# Patient Record
Sex: Female | Born: 1990 | Race: Black or African American | Hispanic: No | Marital: Single | Smoking: Current some day smoker
Health system: Southern US, Community
[De-identification: ages and names within clinical notes are randomized; demographics above are authoritative.]

## PROBLEM LIST (undated history)

## (undated) DIAGNOSIS — J45909 Unspecified asthma, uncomplicated: Secondary | ICD-10-CM

## (undated) HISTORY — PX: NO PAST SURGERIES: SHX2092

---

## 2012-01-14 ENCOUNTER — Emergency Department (HOSPITAL_COMMUNITY)
Admission: EM | Admit: 2012-01-14 | Discharge: 2012-01-14 | Disposition: A | Discharge status: Home / Self Care | Payer: BC Managed Care – PPO | Source: Home / Self Care | Attending: Emergency Medicine | Admitting: Emergency Medicine

## 2012-01-14 ENCOUNTER — Encounter (HOSPITAL_COMMUNITY): Payer: Self-pay | Admitting: Emergency Medicine

## 2012-01-14 DIAGNOSIS — K047 Periapical abscess without sinus: Secondary | ICD-10-CM

## 2012-01-14 MED ORDER — HYDROCODONE-ACETAMINOPHEN 5-325 MG PO TABS: 2.0000 | ORAL_TABLET | ORAL | Status: DC | PRN

## 2012-01-14 MED ORDER — CLINDAMYCIN HCL 150 MG PO CAPS: 150.0000 mg | ORAL_CAPSULE | Freq: Three times a day (TID) | ORAL | Status: AC

## 2012-01-14 MED ORDER — PENICILLIN V POTASSIUM 500 MG PO TABS: 500.0000 mg | ORAL_TABLET | Freq: Four times a day (QID) | ORAL | Status: AC

## 2012-01-14 NOTE — ED Notes (Signed)
Reports tooth pain on left side since Saturday night.  Patient states her mouth is swollen and hurts badly.  Heat pad therapy completed but no relief.

## 2012-01-14 NOTE — ED Provider Notes (Signed)
History     CSN: [id number]  Arrival date & time 01/14/12  1335   First MD Initiated Contact with Patient 01/14/12 1354      Chief Complaint  Patient presents with  . Dental Pain    (Consider location/radiation/quality/duration/timing/severity/associated sxs/prior treatment) Patient is a 21 y.o. female presenting with tooth pain. The history is provided by the patient.  Dental PainThe primary symptoms include mouth pain. Primary symptoms do not include oral lesions, headaches, fever, shortness of breath, sore throat, angioedema or cough. The symptoms began 12 to 24 hours ago. The symptoms are worsening. The symptoms are new. The symptoms occur constantly.  Additional symptoms include: dental sensitivity to temperature, gum swelling, gum tenderness and facial swelling. Additional symptoms do not include: trouble swallowing, pain with swallowing, ear pain, hearing loss, swollen glands, goiter and fatigue.    History reviewed. No pertinent past medical history.  History reviewed. No pertinent past surgical history.  History reviewed. No pertinent family history.  History  Substance Use Topics  . Smoking status: Not on file  . Smokeless tobacco: Not on file  . Alcohol Use: Not on file    OB History    Grav Para Term Preterm Abortions TAB SAB Ect Mult Living                  Review of Systems  Constitutional: Positive for activity change and appetite change. Negative for fever, chills and fatigue.  HENT: Positive for facial swelling. Negative for hearing loss, ear pain, sore throat, trouble swallowing, neck pain and neck stiffness.   Respiratory: Negative for cough and shortness of breath.   Gastrointestinal: Negative for nausea and diarrhea.  Genitourinary: Negative for dysuria.  Skin: Negative for rash and wound.  Neurological: Negative for dizziness and headaches.    Allergies  Review of patient's allergies indicates no known allergies.  Home Medications   Current  Outpatient Rx  Name  Route  Sig  Dispense  Refill  . CLINDAMYCIN HCL 150 MG PO CAPS   Oral   Take 1 capsule (150 mg total) by mouth 3 (three) times daily.   15 capsule   0   . HYDROCODONE-ACETAMINOPHEN 5-325 MG PO TABS   Oral   Take 2 tablets by mouth every 4 (four) hours as needed for pain.   10 tablet   0   . PENICILLIN V POTASSIUM 500 MG PO TABS   Oral   Take 1 tablet (500 mg total) by mouth 4 (four) times daily.   28 tablet   0     BP 141/88  Pulse 73  Temp 99.7 F (37.6 C) (Oral)  Resp 18  SpO2 99%  Physical Exam  Constitutional: Vital signs are normal. She appears well-nourished.  Non-toxic appearance. She does not have a sickly appearance. She does not appear ill. No distress.  HENT:  Head: Normocephalic.  Right Ear: Tympanic membrane normal.  Left Ear: Tympanic membrane normal.  Mouth/Throat: Uvula is midline and mucous membranes are normal. Posterior oropharyngeal erythema present.  Eyes: Conjunctivae normal are normal.  Neck: Neck supple. No thyromegaly present.  Pulmonary/Chest: Effort normal and breath sounds normal. She has no decreased breath sounds. She has no wheezes. She has no rhonchi. She has no rales.  Abdominal: Soft.  Lymphadenopathy:    She has no cervical adenopathy.  Neurological: She is alert.  Skin: No rash noted.    ED Course  Procedures (including critical care time)  Labs Reviewed - No data to display  No results found.   1. Dental abscess       MDM  Abscessed left lower molar-penicillin        Jimmie Molly, MD 01/14/12 [phone]

## 2013-08-28 ENCOUNTER — Encounter (HOSPITAL_COMMUNITY): Payer: Self-pay

## 2013-08-28 ENCOUNTER — Inpatient Hospital Stay (HOSPITAL_COMMUNITY)
Admission: AD | Admit: 2013-08-28 | Discharge: 2013-08-28 | Disposition: A | Discharge status: Home / Self Care | Payer: BC Managed Care – PPO | Source: Ambulatory Visit | Attending: Obstetrics & Gynecology | Admitting: Obstetrics & Gynecology

## 2013-08-28 ENCOUNTER — Inpatient Hospital Stay (HOSPITAL_COMMUNITY): Payer: BC Managed Care – PPO

## 2013-08-28 DIAGNOSIS — R109 Unspecified abdominal pain: Secondary | ICD-10-CM | POA: Insufficient documentation

## 2013-08-28 DIAGNOSIS — O209 Hemorrhage in early pregnancy, unspecified: Secondary | ICD-10-CM | POA: Insufficient documentation

## 2013-08-28 DIAGNOSIS — N760 Acute vaginitis: Secondary | ICD-10-CM | POA: Diagnosis not present

## 2013-08-28 DIAGNOSIS — B9689 Other specified bacterial agents as the cause of diseases classified elsewhere: Secondary | ICD-10-CM | POA: Diagnosis not present

## 2013-08-28 DIAGNOSIS — A499 Bacterial infection, unspecified: Secondary | ICD-10-CM | POA: Diagnosis not present

## 2013-08-28 DIAGNOSIS — O2390 Unspecified genitourinary tract infection in pregnancy, unspecified trimester: Secondary | ICD-10-CM | POA: Insufficient documentation

## 2013-08-28 HISTORY — DX: Unspecified asthma, uncomplicated: J45.909

## 2013-08-28 LAB — CBC
HCT: 35.9 % — ABNORMAL LOW (ref 36.0–46.0)
Hemoglobin: 12.1 g/dL (ref 12.0–15.0)
MCH: 33 pg (ref 26.0–34.0)
MCHC: 33.7 g/dL (ref 30.0–36.0)
MCV: 97.8 fL (ref 78.0–100.0)
PLATELETS: 176 10*3/uL (ref 150–400)
RBC: 3.67 MIL/uL — AB (ref 3.87–5.11)
RDW: 11.8 % (ref 11.5–15.5)
WBC: 7.6 10*3/uL (ref 4.0–10.5)

## 2013-08-28 LAB — URINALYSIS, ROUTINE W REFLEX MICROSCOPIC
BILIRUBIN URINE: NEGATIVE
Glucose, UA: NEGATIVE mg/dL
Hgb urine dipstick: NEGATIVE
KETONES UR: NEGATIVE mg/dL
Leukocytes, UA: NEGATIVE
NITRITE: NEGATIVE
PH: 7 (ref 5.0–8.0)
Protein, ur: NEGATIVE mg/dL
Specific Gravity, Urine: 1.01 (ref 1.005–1.030)
UROBILINOGEN UA: 1 mg/dL (ref 0.0–1.0)

## 2013-08-28 LAB — WET PREP, GENITAL
Trich, Wet Prep: NONE SEEN
Yeast Wet Prep HPF POC: NONE SEEN

## 2013-08-28 LAB — ABO/RH: ABO/RH(D): O POS

## 2013-08-28 LAB — POCT PREGNANCY, URINE: Preg Test, Ur: POSITIVE — AB

## 2013-08-28 LAB — HCG, QUANTITATIVE, PREGNANCY: hCG, Beta Chain, Quant, S: 10102 m[IU]/mL — ABNORMAL HIGH (ref <5)

## 2013-08-28 MED ORDER — PRENATAL VITAMINS PLUS 27-1 MG PO TABS: 1.0000 | ORAL_TABLET | ORAL | Status: DC

## 2013-08-28 MED ORDER — METRONIDAZOLE 500 MG PO TABS: 500.0000 mg | ORAL_TABLET | Freq: Three times a day (TID) | ORAL | Status: AC

## 2013-08-28 NOTE — MAU Provider Note (Signed)
Attestation of Attending Supervision of Advanced Practitioner (CNM/NP): Evaluation and management procedures were performed by the Advanced Practitioner under my supervision and collaboration. I have reviewed the Advanced Practitioner's note and chart, and I agree with the management and plan.  LEGGETT,KELLY H. 7:11 PM

## 2013-08-28 NOTE — Discharge Instructions (Signed)
Vaginal Bleeding During Pregnancy, First Trimester A small amount of bleeding (spotting) from the vagina is relatively common in early pregnancy. It usually stops on its own. Various things may cause bleeding or spotting in early pregnancy. Some bleeding may be related to the pregnancy, and some may not. In most cases, the bleeding is normal and is not a problem. However, bleeding can also be a sign of something serious. Be sure to tell your health care provider about any vaginal bleeding right away. Some possible causes of vaginal bleeding during the first trimester include:  Infection or inflammation of the cervix.  Growths (polyps) on the cervix.  Miscarriage or threatened miscarriage.  Pregnancy tissue has developed outside of the uterus and in a fallopian tube (tubal pregnancy).  Tiny cysts have developed in the uterus instead of pregnancy tissue (molar pregnancy). HOME CARE INSTRUCTIONS  Watch your condition for any changes. The following actions may help to lessen any discomfort you are feeling:  Follow your health care provider's instructions for limiting your activity. If your health care provider orders bed rest, you may need to stay in bed and only get up to use the bathroom. However, your health care provider may allow you to continue light activity.  If needed, make plans for someone to help with your regular activities and responsibilities while you are on bed rest.  Keep track of the number of pads you use each day, how often you change pads, and how soaked (saturated) they are. Write this down.  Do not use tampons. Do not douche.  Do not have sexual intercourse or orgasms until approved by your health care provider.  If you pass any tissue from your vagina, save the tissue so you can show it to your health care provider.  Only take over-the-counter or prescription medicines as directed by your health care provider.  Do not take aspirin because it can make you  bleed.  Keep all follow-up appointments as directed by your health care provider. SEEK MEDICAL CARE IF:  You have any vaginal bleeding during any part of your pregnancy.  You have cramps or labor pains.  You have a fever, not controlled by medicine. SEEK IMMEDIATE MEDICAL CARE IF:   You have severe cramps in your back or belly (abdomen).  You pass large clots or tissue from your vagina.  Your bleeding increases.  You feel light-headed or weak, or you have fainting episodes.  You have chills.  You are leaking fluid or have a gush of fluid from your vagina.  You pass out while having a bowel movement. MAKE SURE YOU:  Understand these instructions.  Will watch your condition.  Will get help right away if you are not doing well or get worse. Document Released: 10/18/2004 Document Revised: 01/13/2013 Document Reviewed: 09/15/2012 [facility] Patient Information 2015 [place], [place]. This information is not intended to replace advice given to you by your health care provider. Make sure you discuss any questions you have with your health care provider.  Bacterial Vaginosis Bacterial vaginosis is a vaginal infection that occurs when the normal balance of bacteria in the vagina is disrupted. It results from an overgrowth of certain bacteria. This is the most common vaginal infection in women of childbearing age. Treatment is important to prevent complications, especially in pregnant women, as it can cause a premature delivery. CAUSES  Bacterial vaginosis is caused by an increase in harmful bacteria that are normally present in smaller amounts in the vagina. Several different kinds of bacteria  cause bacterial vaginosis. However, the reason that the condition develops is not fully understood. °RISK FACTORS °Certain activities or behaviors can put you at an increased risk of developing bacterial vaginosis, including: °· Having a new sex partner or multiple sex partners. °· Douching. °· Using  an intrauterine device (IUD) for contraception. °Women do not get bacterial vaginosis from toilet seats, bedding, swimming pools, or contact with objects around them. °SIGNS AND SYMPTOMS  °Some women with bacterial vaginosis have no signs or symptoms. Common symptoms include: °· Grey vaginal discharge. °· A fishlike odor with discharge, especially after sexual intercourse. °· Itching or burning of the vagina and vulva. °· Burning or pain with urination. °DIAGNOSIS  °Your health care provider will take a medical history and examine the vagina for signs of bacterial vaginosis. A sample of vaginal fluid may be taken. Your health care provider will look at this sample under a microscope to check for bacteria and abnormal cells. A vaginal pH test may also be done.  °TREATMENT  °Bacterial vaginosis may be treated with antibiotic medicines. These may be given in the form of a pill or a vaginal cream. A second round of antibiotics may be prescribed if the condition comes back after treatment.  °HOME CARE INSTRUCTIONS  °· Only take over-the-counter or prescription medicines as directed by your health care provider. °· If antibiotic medicine was prescribed, take it as directed. Make sure you finish it even if you start to feel better. °· Do not have sex until treatment is completed. °· Tell all sexual partners that you have a vaginal infection. They should see their health care provider and be treated if they have problems, such as a mild rash or itching. °· Practice safe sex by using condoms and only having one sex partner. °SEEK MEDICAL CARE IF:  °· Your symptoms are not improving after 3 days of treatment. °· You have increased discharge or pain. °· You have a fever. °MAKE SURE YOU:  °· Understand these instructions. °· Will watch your condition. °· Will get help right away if you are not doing well or get worse. °FOR MORE INFORMATION  °Centers for Disease Control and Prevention, Division of STD Prevention:  www.cdc.gov/std °American Sexual Health Association (ASHA): www.ashastd.org  °Document Released: 01/08/2005 Document Revised: 10/29/2012 Document Reviewed: 08/20/2012 °ExitCare® Patient Information ©2015 [employer], LLC. This information is not intended to replace advice given to you by your health care provider. Make sure you discuss any questions you have with your health care provider. ° °

## 2013-08-28 NOTE — MAU Provider Note (Signed)
Chief Complaint  Patient presents with  . Possible Pregnancy    Subjective Michele Navarro 23 y.o.  G2P0010 at 352w1d by LMP presents with onset today of first episode of small amount pink postcoital vaginal bleeding 3 days ago, last noted yesterday. Has menstrual-like crampy lower abdominal pain.  Denies irritative vaginal discharge. No dysuria or hematuria.  Blood type: unknown  Pregnancy course: NPC  Pertinent Medical History: asthma (no meds) Pertinent Ob/Gyn History: early EAB x1 Pertinent Surgical History: none Pertinent Social History: [place]  No prescriptions prior to admission    No Known Allergies   Objective   Filed Vitals:   08/28/13 1121  BP: 123/57  Pulse: 61  Temp: 98.1 F (36.7 C)  Resp: 16     Physical Exam General: WN/WD in NAD  Abdom: soft, NT External genitalia: normal; BUS neg  SSE: no blood; scant white discharge; cervix with no lesions, appears closed Bimanual: Cervix closed, long; uterus anteverted, NT, 6 weeks size; adnexa nontender, no masses   Lab Results Results for orders placed during the hospital encounter of 08/28/13 (from the past 24 hour(s))  URINALYSIS, ROUTINE W REFLEX MICROSCOPIC     Status: None   Collection Time    08/28/13 11:29 AM      Result Value Ref Range   Color, Urine YELLOW  YELLOW   APPearance CLEAR  CLEAR   Specific Gravity, Urine 1.010  1.005 - 1.030   pH 7.0  5.0 - 8.0   Glucose, UA NEGATIVE  NEGATIVE mg/dL   Hgb urine dipstick NEGATIVE  NEGATIVE   Bilirubin Urine NEGATIVE  NEGATIVE   Ketones, ur NEGATIVE  NEGATIVE mg/dL   Protein, ur NEGATIVE  NEGATIVE mg/dL   Urobilinogen, UA 1.0  0.0 - 1.0 mg/dL   Nitrite NEGATIVE  NEGATIVE   Leukocytes, UA NEGATIVE  NEGATIVE  POCT PREGNANCY, URINE     Status: Abnormal   Collection Time    08/28/13 12:02 PM      Result Value Ref Range   Preg Test, Ur POSITIVE (*) NEGATIVE  CBC     Status: Abnormal   Collection Time    08/28/13 12:31 PM      Result Value Ref Range   WBC  7.6  4.0 - 10.5 K/uL   RBC 3.67 (*) 3.87 - 5.11 MIL/uL   Hemoglobin 12.1  12.0 - 15.0 g/dL   HCT 09.835.9 (*) 11.936.0 - 14.746.0 %   MCV 97.8  78.0 - 100.0 fL   MCH 33.0  26.0 - 34.0 pg   MCHC 33.7  30.0 - 36.0 g/dL   RDW 82.911.8  56.211.5 - 13.015.5 %   Platelets 176  150 - 400 K/uL  ABO/RH     Status: None   Collection Time    08/28/13 12:31 PM      Result Value Ref Range   ABO/RH(D) O POS    HCG, QUANTITATIVE, PREGNANCY     Status: Abnormal   Collection Time    08/28/13 12:32 PM      Result Value Ref Range   hCG, Beta Chain, Quant, S 10102 (*) <5 mIU/mL  WET PREP, GENITAL     Status: Abnormal   Collection Time    08/28/13 12:45 PM      Result Value Ref Range   Yeast Wet Prep HPF POC NONE SEEN  NONE SEEN   Trich, Wet Prep NONE SEEN  NONE SEEN   Clue Cells Wet Prep HPF POC FEW (*) NONE SEEN  WBC, Wet Prep HPF POC FEW (*) NONE SEEN    Ultrasound  US Ob Comp Less 14 Wks  08/28/2013   CLINICAL DATA:  Early pregnancy, spotting  EXAM: OBSTETRIC <14 WK [place] AND TRANSVAGINAL OB US  TECHNIQUE: Both transabdominal and transvaginal ultrasound examinations were performed for complete evaluation of the gestation as well as the maternal uterus, adnexal regions, and pelvic cul-de-sac. Transvaginal technique was performed to assess early pregnancy.  COMPARISON:  None.  FINDINGS: Intrauterine gestational sac: Single  Yolk sac:  Present  Embryo:  Present  Cardiac Activity: Not identified  CRL:   2.4  mm   5 w 6 d                  [place] EDC: 04/24/2014  Maternal uterus/adnexae: There is a 3 cm simple cyst associated with the left ovary. Normal right ovary. Trace free fluid.  IMPRESSION: Single intrauterine gestation with embryo. No cardiac activity demonstrated at this stage.  Estimated gestational age by crown-rump length equals 5 weeks 6 days.   Electronically Signed   By: Genevive Bi M.D.   On: 08/28/2013 14:01   US Ob Transvaginal  08/28/2013   CLINICAL DATA:  Early pregnancy, spotting  EXAM: OBSTETRIC <14 WK [place] AND  TRANSVAGINAL OB US  TECHNIQUE: Both transabdominal and transvaginal ultrasound examinations were performed for complete evaluation of the gestation as well as the maternal uterus, adnexal regions, and pelvic cul-de-sac. Transvaginal technique was performed to assess early pregnancy.  COMPARISON:  None.  FINDINGS: Intrauterine gestational sac: Single  Yolk sac:  Present  Embryo:  Present  Cardiac Activity: Not identified  CRL:   2.4  mm   5 w 6 d                  [place] EDC: 04/24/2014  Maternal uterus/adnexae: There is a 3 cm simple cyst associated with the left ovary. Normal right ovary. Trace free fluid.  IMPRESSION: Single intrauterine gestation with embryo. No cardiac activity demonstrated at this stage.  Estimated gestational age by crown-rump length equals 5 weeks 6 days.   Electronically Signed   By: Genevive Bi M.D.   On: 08/28/2013 14:01   Assessment 1. Bleeding in early pregnancy   2. BV (bacterial vaginosis)    IUP, viability undetermined  Plan    GC/CT sent Discharge home See AVS for pt education   Medication List         metroNIDAZOLE 500 MG tablet  Commonly known as:  FLAGYL  Take 1 tablet (500 mg total) by mouth 3 (three) times daily.     PRENATAL VITAMINS PLUS 27-1 MG Tabs  Take 1 tablet by mouth 1 day or 1 dose.         Follow-up Information   Follow up with [facility] [place]. (Choose pregnancy care provider from the list given)    Contact information:   [address] [place] [address] [phone]      Follow up In 10 days. (Someone from [facility] will call with [place] appointmnet)        Andie Mortimer 08/28/2013 12:29 PM

## 2013-08-28 NOTE — MAU Note (Signed)
Patient states she has had a positive home pregnancy test x 3.  Wants confirmation.  Has had spotting yesterday and the day before but none today. Denies pain, or vomiting but has nausea off and on.

## 2013-08-29 LAB — GC/CHLAMYDIA PROBE AMP
CT Probe RNA: NEGATIVE
GC PROBE AMP APTIMA: NEGATIVE

## 2013-09-20 ENCOUNTER — Emergency Department (HOSPITAL_COMMUNITY): Payer: BC Managed Care – PPO

## 2013-09-20 ENCOUNTER — Emergency Department (HOSPITAL_COMMUNITY)
Admission: EM | Admit: 2013-09-20 | Discharge: 2013-09-20 | Disposition: A | Discharge status: Home / Self Care | Payer: BC Managed Care – PPO | Attending: Emergency Medicine | Admitting: Emergency Medicine

## 2013-09-20 ENCOUNTER — Encounter (HOSPITAL_COMMUNITY): Payer: Self-pay | Admitting: Emergency Medicine

## 2013-09-20 DIAGNOSIS — O99330 Smoking (tobacco) complicating pregnancy, unspecified trimester: Secondary | ICD-10-CM | POA: Insufficient documentation

## 2013-09-20 DIAGNOSIS — O209 Hemorrhage in early pregnancy, unspecified: Secondary | ICD-10-CM | POA: Diagnosis present

## 2013-09-20 DIAGNOSIS — J45909 Unspecified asthma, uncomplicated: Secondary | ICD-10-CM | POA: Insufficient documentation

## 2013-09-20 LAB — CBC WITH DIFFERENTIAL/PLATELET
Basophils Absolute: 0 10*3/uL (ref 0.0–0.1)
Basophils Relative: 0 % (ref 0–1)
Eosinophils Absolute: 0.1 10*3/uL (ref 0.0–0.7)
Eosinophils Relative: 1 % (ref 0–5)
HCT: 33 % — ABNORMAL LOW (ref 36.0–46.0)
Hemoglobin: 11.4 g/dL — ABNORMAL LOW (ref 12.0–15.0)
LYMPHS ABS: 2.6 10*3/uL (ref 0.7–4.0)
LYMPHS PCT: 27 % (ref 12–46)
MCH: 32.4 pg (ref 26.0–34.0)
MCHC: 34.5 g/dL (ref 30.0–36.0)
MCV: 93.8 fL (ref 78.0–100.0)
Monocytes Absolute: 0.7 10*3/uL (ref 0.1–1.0)
Monocytes Relative: 7 % (ref 3–12)
NEUTROS ABS: 6.4 10*3/uL (ref 1.7–7.7)
NEUTROS PCT: 65 % (ref 43–77)
PLATELETS: 168 10*3/uL (ref 150–400)
RBC: 3.52 MIL/uL — AB (ref 3.87–5.11)
RDW: 11.1 % — ABNORMAL LOW (ref 11.5–15.5)
WBC: 9.7 10*3/uL (ref 4.0–10.5)

## 2013-09-20 LAB — WET PREP, GENITAL
TRICH WET PREP: NONE SEEN
WBC WET PREP: NONE SEEN
Yeast Wet Prep HPF POC: NONE SEEN

## 2013-09-20 LAB — ABO/RH: ABO/RH(D): O POS

## 2013-09-20 LAB — HCG, QUANTITATIVE, PREGNANCY: HCG, BETA CHAIN, QUANT, S: 119776 m[IU]/mL — AB (ref <5)

## 2013-09-20 MED ORDER — METRONIDAZOLE 500 MG PO TABS: 500.0000 mg | ORAL_TABLET | Freq: Two times a day (BID) | ORAL | Status: DC

## 2013-09-20 MED ORDER — METRONIDAZOLE 50 MG/ML ORAL SUSPENSION: 500.0000 mg | Freq: Two times a day (BID) | ORAL | Status: AC

## 2013-09-20 MED ORDER — ONDANSETRON 4 MG PO TBDP: ORAL_TABLET | ORAL | Status: AC

## 2013-09-20 MED ORDER — PRENATAL COMPLETE 14-0.4 MG PO TABS: 1.0000 | ORAL_TABLET | Freq: Every day | ORAL | Status: AC

## 2013-09-20 NOTE — ED Notes (Signed)
Pt about [redacted] weeks pregnant, new onset vaginal bleeding that started around 2300 last night. States she had moderate amount of bright red blood when wiping. G2, P 0. Previous hx of elective abortion.

## 2013-09-20 NOTE — Discharge Instructions (Signed)
Vaginal Bleeding During Pregnancy, First Trimester Ms. Oppedisano, you were seen today for vaginal bleeding.  Your ultrasound results are below.  You likely had a threatened miscarriage. Follow up with an OB doctor within one week for continued care.  You were also found to have BV.  Continue to take flagyl until it is completed.  If you have any worsening of your symptoms, return to the ED immediately for repeat evaluation.  Thank you.  FINDINGS: Intrauterine gestational sac: Visualized/normal in shape.  Yolk sac: Present  Embryo: Present  Cardiac Activity: Presents  Heart Rate: 168 bpm  CRL: 25.9 mm 9 w 3 d [place] EDC: April 22, 2014  Maternal uterus/adnexae: No subchorionic hemorrhage. No free fluid. Stable 3.6 cm anechoic benign-appearing cyst left ovary.  IMPRESSION: Single live intrauterine pregnancy, gestational age [redacted] weeks and 3 days by ultrasound ; no immediate complications.  A small amount of bleeding (spotting) from the vagina is common in early pregnancy. Sometimes the bleeding is normal and is not a problem, and sometimes it is a sign of something serious. Be sure to tell your doctor about any bleeding from your vagina right away. HOME CARE  Watch your condition for any changes.  Follow your doctor's instructions about how active you can be.  If you are on bed rest:  You may need to stay in bed and only get up to use the bathroom.  You may be allowed to do some activities.  If you need help, make plans for someone to help you.  Write down:  The number of pads you use each day.  How often you change pads.  How soaked (saturated) your pads are.  Do not use tampons.  Do not douche.  Do not have sex or orgasms until your doctor says it is okay.  If you pass any tissue from your vagina, save the tissue so you can show it to your doctor.  Only take medicines as told by your doctor.  Do not take aspirin because it can make you bleed.  Keep all follow-up  visits as told by your doctor. GET HELP IF:   You bleed from your vagina.  You have cramps.  You have labor pains.  You have a fever that does not go away after you take medicine. GET HELP RIGHT AWAY IF:   You have very bad cramps in your back or belly (abdomen).  You pass large clots or tissue from your vagina.  You bleed more.  You feel light-headed or weak.  You pass out (faint).  You have chills.  You are leaking fluid or have a gush of fluid from your vagina.  You pass out while pooping (having a bowel movement). MAKE SURE YOU:  Understand these instructions.  Will watch your condition.  Will get help right away if you are not doing well or get worse. Document Released: 05/25/2013 Document Reviewed: 09/15/2012 [facility] Patient Information 2015 [place], [place]. This information is not intended to replace advice given to you by your health care provider. Make sure you discuss any questions you have with your health care provider.

## 2013-09-20 NOTE — ED Notes (Signed)
CBC needs to be recollected per Shawn in the lab, states there was not enough blood in the tub

## 2013-09-20 NOTE — ED Provider Notes (Signed)
CSN: [id number]     Arrival date & time 09/20/13  0035 History   First MD Initiated Contact with Patient 09/20/13 0041     Chief Complaint  Patient presents with  . Vaginal Bleeding     (Consider location/radiation/quality/duration/timing/severity/associated sxs/prior Treatment) HPI  Michele Navarro is a 23 y.o. female with no significant past medical history coming in with vaginal bleeding. She is a G2 P0 [redacted] weeks gestation. Patient states today she had bright red bleeding when she went to use the restroom. When she went again later this afternoon it had subsided but was still light pink. She denies any abdominal pain or cramping. She has had unprotected sex with the father of the baby during the interval. Patient denies any fevers or recent infections. She denies any vaginal discharge or vaginal pruritis.  She has no urinary symptoms and no changes in her bowel movements. She has not sought prenatal care yet, she is taking prenatal vitamins and has an appointment this coming week for her first ultrasound 10 Systems reviewed and are negative for acute change except as noted in the HPI.  Marland Kitchen  Past Medical History  Diagnosis Date  . Asthma    Past Surgical History  Procedure Laterality Date  . No past surgeries     Family History  Problem Relation Age of Onset  . Heart disease Maternal Grandmother   . Diabetes Maternal Grandfather   . Heart disease Maternal Grandfather   . Diabetes Paternal Grandmother    History  Substance Use Topics  . Smoking status: Current Some Day Smoker -- 0.50 packs/day  . Smokeless tobacco: Not on file  . Alcohol Use: No   OB History   Grav Para Term Preterm Abortions TAB SAB Ect Mult Living   Review of Systems    Allergies  Review of patient's allergies indicates no known allergies.  Home Medications   Prior to Admission medications   Medication Sig Start Date End Date Taking? Authorizing Provider  metroNIDAZOLE (FLAGYL) 500  MG tablet Take 1 tablet (500 mg total) by mouth 3 (three) times daily. 08/28/13   Deirdre Colin Mulders, CNM  Prenatal Vit-Fe Fumarate-FA (PRENATAL VITAMINS PLUS) 27-1 MG TABS Take 1 tablet by mouth 1 day or 1 dose. 08/28/13   Deirdre Colin Mulders, CNM   BP 114/62  Pulse 76  Temp(Src) 98.2 F (36.8 C) (Oral)  Resp 16  Ht  (1.626 m)  Wt 160 lb (72.576 kg)  BMI 27.45 kg/m2  SpO2 100%  LMP 07/16/2013 Physical Exam  Nursing note and vitals reviewed. Constitutional: She is oriented to person, place, and time. She appears well-developed and well-nourished. No distress.  HENT:  Head: Normocephalic and atraumatic.  Nose: Nose normal.  Mouth/Throat: Oropharynx is clear and moist. No oropharyngeal exudate.  Eyes: Conjunctivae and EOM are normal. Pupils are equal, round, and reactive to light. No scleral icterus.  Neck: Normal range of motion. Neck supple. No JVD present. No tracheal deviation present. No thyromegaly present.  Cardiovascular: Normal rate, regular rhythm and normal heart sounds.  Exam reveals no gallop and no friction rub.   No murmur heard. Pulmonary/Chest: Effort normal and breath sounds normal. No respiratory distress. She has no wheezes. She exhibits no tenderness.  Abdominal: Soft. Bowel sounds are normal. She exhibits no distension and no mass. There is tenderness. There is no rebound and no guarding.  Suprapubic tenderness to palpation.  Genitourinary:  Trace amount of blood in the vaginal vault. No vaginal discharge seen. No lesions seen. No CMT or adnexal tenderness.  I was unable to visualize the cervix on exam to assess to os.  Musculoskeletal: Normal range of motion. She exhibits no edema and no tenderness.  Lymphadenopathy:    She has no cervical adenopathy.  Neurological: She is alert and oriented to person, place, and time. No cranial nerve deficit.  Skin: Skin is warm and dry. No rash noted. She is not diaphoretic. No erythema. No pallor.    ED Course  Procedures  (including critical care time) Labs Review Labs Reviewed  WET PREP, GENITAL - Abnormal; Notable for the following:    Clue Cells Wet Prep HPF POC MANY (*)    All other components within normal limits  HCG, QUANTITATIVE, PREGNANCY - Abnormal; Notable for the following:    hCG, Beta Francene Finders [id number] (*)    All other components within normal limits  CBC WITH DIFFERENTIAL - Abnormal; Notable for the following:    RBC 3.52 (*)    Hemoglobin 11.4 (*)    HCT 33.0 (*)    RDW 11.1 (*)    All other components within normal limits  GC/CHLAMYDIA PROBE AMP  CBC WITH DIFFERENTIAL  ABO/RH    Imaging Review US Ob Limited  09/20/2013   CLINICAL DATA:  Nine weeks pregnant, vaginal bleeding.  EXAM: OBSTETRIC <14 WK ULTRASOUND  TECHNIQUE: Transabdominal ultrasound was performed for evaluation of the gestation as well as the maternal uterus and adnexal regions.  COMPARISON:  Obstetric ultrasound August 28, 2013  FINDINGS: Intrauterine gestational sac: Visualized/normal in shape.  Yolk sac:  Present  Embryo:  Present  Cardiac Activity: Presents  Heart Rate: 168 bpm  CRL:   25.9  mm   9 w 3 d                  [place] EDC: April 22, 2014  Maternal uterus/adnexae: No subchorionic hemorrhage. No free fluid. Stable 3.6 cm anechoic benign-appearing cyst left ovary.  IMPRESSION: Single live intrauterine pregnancy, gestational age [redacted] weeks and 3 days by ultrasound ; no immediate complications.   Electronically Signed   By: Awilda Metro   On: 09/20/2013 03:35     EKG Interpretation None      MDM   Final diagnoses:  None    Patient presents to the emergency department a concern for vaginal bleeding. In the first trimester, and concern for possible ectopic pregnancy, incomplete abortion, or threatened abortion. Testing was sent off for sexual transmitted infections as well. We will obtain hCG Quant and a blood type. Patient will get an official transvaginal ultrasound to assess the fetus. Bedside ultrasound  revealed a live IUP.  Ultrasound reveal live IUP, HR 170s, 9we3d gestation.  No ectopic.  [place] is unable to asses os due to extreme anterversion.  Because of this I counseled the patient on both threated and incomplete abortion.  She will follow up with ob in 2 days and was given the phone number.  She is aware she has BV and is currently on flagyl at home.  We give her nausea medication for morning sickness and refill her prenatal vitamins.  Pregnancy DC instructions were provided    Tomasita Crumble, MD 09/20/13 1731

## 2013-09-22 LAB — GC/CHLAMYDIA PROBE AMP
CT Probe RNA: NEGATIVE
GC Probe RNA: NEGATIVE

## 2013-11-23 ENCOUNTER — Encounter (HOSPITAL_COMMUNITY): Payer: Self-pay | Admitting: Emergency Medicine

## 2014-07-03 ENCOUNTER — Encounter (HOSPITAL_COMMUNITY): Payer: Self-pay | Admitting: *Deleted

## 2014-10-29 ENCOUNTER — Emergency Department (HOSPITAL_COMMUNITY)
Admission: EM | Admit: 2014-10-29 | Discharge: 2014-10-29 | Disposition: A | Discharge status: Home / Self Care | Payer: Self-pay | Attending: Emergency Medicine | Admitting: Emergency Medicine

## 2014-10-29 ENCOUNTER — Encounter (HOSPITAL_COMMUNITY): Payer: Self-pay | Admitting: Emergency Medicine

## 2014-10-29 ENCOUNTER — Emergency Department (HOSPITAL_COMMUNITY): Payer: Self-pay

## 2014-10-29 DIAGNOSIS — Y998 Other external cause status: Secondary | ICD-10-CM | POA: Insufficient documentation

## 2014-10-29 DIAGNOSIS — Z792 Long term (current) use of antibiotics: Secondary | ICD-10-CM | POA: Insufficient documentation

## 2014-10-29 DIAGNOSIS — Y92410 Unspecified street and highway as the place of occurrence of the external cause: Secondary | ICD-10-CM | POA: Insufficient documentation

## 2014-10-29 DIAGNOSIS — S46912A Strain of unspecified muscle, fascia and tendon at shoulder and upper arm level, left arm, initial encounter: Secondary | ICD-10-CM | POA: Insufficient documentation

## 2014-10-29 DIAGNOSIS — J45909 Unspecified asthma, uncomplicated: Secondary | ICD-10-CM | POA: Insufficient documentation

## 2014-10-29 DIAGNOSIS — Z79899 Other long term (current) drug therapy: Secondary | ICD-10-CM | POA: Insufficient documentation

## 2014-10-29 DIAGNOSIS — Z720 Tobacco use: Secondary | ICD-10-CM | POA: Insufficient documentation

## 2014-10-29 DIAGNOSIS — Y9389 Activity, other specified: Secondary | ICD-10-CM | POA: Insufficient documentation

## 2014-10-29 MED ORDER — IBUPROFEN 200 MG PO TABS
400.0000 mg | ORAL_TABLET | Freq: Once | ORAL | Status: AC
  Administered 2014-10-29: 400 mg via ORAL
  Filled 2014-10-29: qty 2

## 2014-10-29 MED ORDER — METHOCARBAMOL 500 MG PO TABS: 500.0000 mg | ORAL_TABLET | Freq: Two times a day (BID) | ORAL | Status: AC

## 2014-10-29 MED ORDER — METHOCARBAMOL 500 MG PO TABS
500.0000 mg | ORAL_TABLET | Freq: Once | ORAL | Status: AC
  Administered 2014-10-29: 500 mg via ORAL
  Filled 2014-10-29: qty 1
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
## 2014-10-29 NOTE — ED Notes (Signed)
Pt called [facility] for transportation home.
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
